# Patient Record
Sex: Male | Born: 1985 | Race: White | Hispanic: No | Marital: Married | Smoking: Never smoker
Health system: Southern US, Community
[De-identification: ages and names within clinical notes are randomized; demographics above are authoritative.]

## PROBLEM LIST (undated history)

## (undated) HISTORY — PX: BACK SURGERY: SHX140

## (undated) HISTORY — PX: KNEE SURGERY: SHX244

---

## 2018-12-04 ENCOUNTER — Ambulatory Visit
Admission: RE | Admit: 2018-12-04 | Discharge: 2018-12-04 | Disposition: A | Discharge status: Home / Self Care | Payer: BLUE CROSS/BLUE SHIELD | Attending: Family Medicine | Admitting: Family Medicine

## 2018-12-04 ENCOUNTER — Other Ambulatory Visit: Payer: Self-pay

## 2018-12-04 ENCOUNTER — Other Ambulatory Visit: Payer: Self-pay | Admitting: Family Medicine

## 2018-12-04 ENCOUNTER — Ambulatory Visit
Admission: RE | Admit: 2018-12-04 | Discharge: 2018-12-04 | Disposition: A | Discharge status: Home / Self Care | Payer: BLUE CROSS/BLUE SHIELD | Source: Ambulatory Visit | Attending: Family Medicine | Admitting: Family Medicine

## 2018-12-04 DIAGNOSIS — D334 Benign neoplasm of spinal cord: Secondary | ICD-10-CM | POA: Insufficient documentation

## 2019-03-08 ENCOUNTER — Ambulatory Visit (INDEPENDENT_AMBULATORY_CARE_PROVIDER_SITE_OTHER): Payer: BC Managed Care – PPO

## 2019-03-08 ENCOUNTER — Ambulatory Visit
Admission: EM | Admit: 2019-03-08 | Discharge: 2019-03-08 | Disposition: A | Discharge status: Home / Self Care | Payer: BC Managed Care – PPO | Attending: Family Medicine | Admitting: Family Medicine

## 2019-03-08 ENCOUNTER — Other Ambulatory Visit: Payer: Self-pay

## 2019-03-08 DIAGNOSIS — M79671 Pain in right foot: Secondary | ICD-10-CM | POA: Diagnosis not present

## 2019-03-08 DIAGNOSIS — S91331A Puncture wound without foreign body, right foot, initial encounter: Secondary | ICD-10-CM

## 2019-03-08 DIAGNOSIS — W450XXA Nail entering through skin, initial encounter: Secondary | ICD-10-CM | POA: Diagnosis not present

## 2019-03-08 DIAGNOSIS — Z23 Encounter for immunization: Secondary | ICD-10-CM

## 2019-03-08 MED ORDER — TETANUS-DIPHTH-ACELL PERTUSSIS 5-2.5-18.5 LF-MCG/0.5 IM SUSP
0.5000 mL | Freq: Once | INTRAMUSCULAR | Status: AC
  Administered 2019-03-08: 0.5 mL via INTRAMUSCULAR

## 2019-03-08 MED ORDER — CEPHALEXIN 500 MG PO CAPS: 500.0000 mg | ORAL_CAPSULE | Freq: Four times a day (QID) | ORAL | 0 refills | Status: DC

## 2019-03-08 MED ORDER — CIPROFLOXACIN HCL 500 MG PO TABS: 500.0000 mg | ORAL_TABLET | Freq: Two times a day (BID) | ORAL | 0 refills | Status: DC

## 2019-03-08 NOTE — ED Provider Notes (Signed)
[facility] ____________________________________________  Time seen: Approximately 12:19 PM  I have reviewed the triage vital signs and the nursing notes.   HISTORY  Chief Complaint Puncture Wound   HPI Terry Yang is a 33 y.o. male presenting for evaluation of right foot pain post injury that occurred just prior to arrival.  Patient reports this morning he was walking outside around his fire pit and stepped on a nail sticking out of a board.  States he was wearing thin rubber soled flip-flops.  States he immediately pulled his foot off of the nail removing the nail.  States the area did bleed immediately and then for a few more minutes.  Denies continued bleeding.  States currently the foot does not hurt.  Has remained ambulatory.  Denies aggravating or alleviating factors.  Unsure of last tetanus immunization.  Reports otherwise feeling well.  No recent cough, congestion, chest pain or shortness of breath or fevers.  No recent antibiotic use.   History reviewed. No pertinent past medical history.  There are no active problems to display for this patient.   Past Surgical History:  Procedure Laterality Date  . BACK SURGERY    . KNEE SURGERY Bilateral      No current facility-administered medications for this encounter.   Current Outpatient Medications:  .  cephALEXin (KEFLEX) 500 MG capsule, Take 1 capsule (500 mg total) by mouth 4 (four) times daily for 7 days., Disp: 28 capsule, Rfl: 0 .  ciprofloxacin (CIPRO) 500 MG tablet, Take 1 tablet (500 mg total) by mouth every 12 (twelve) hours for 3 days., Disp: 6 tablet, Rfl: 0  Allergies Patient has no known allergies.  History reviewed. No pertinent family history.  Social History Social History   Tobacco Use  . Smoking status: Never Smoker  . Smokeless tobacco: Never Used  Substance Use Topics  . Alcohol use: Yes    Comment: occasionally  . Drug use: Not Currently    Review of Systems  Constitutional: No fever ENT: No sore throat. Cardiovascular: Denies chest pain. Respiratory: Denies shortness of breath. Gastrointestinal: No abdominal pain.   Musculoskeletal: Positive for previous right foot pain. Skin: Positive wound.  ____________________________________________   PHYSICAL EXAM:  VITAL SIGNS: ED Triage Vitals  Enc Vitals Group     BP 03/08/19 1146 (!) 138/99     Pulse Rate 03/08/19 1146 84     Resp 03/08/19 1146 16     Temp 03/08/19 1146 98 F (36.7 C)     Temp Source 03/08/19 1146 Oral     SpO2 03/08/19 1146 100 %     Weight 03/08/19 1144 190 lb (86.2 kg)     Height 03/08/19 1144 5\' 8"  (1.727 m)     Head Circumference --      Peak Flow --      Pain Score 03/08/19 1144 2     Pain Loc --      Pain Edu? --      Excl. in [place]? --     Constitutional: Alert and oriented. Well appearing and in no acute distress. ENT      Head: Normocephalic and atraumatic. Cardiovascular: Normal rate, regular rhythm. Grossly normal heart sounds.  Good peripheral circulation. Respiratory: Normal respiratory effort without tachypnea nor retractions. Breath sounds are clear and equal bilaterally. No wheezes, rales, rhonchi. Musculoskeletal: Steady gait.  Right foot distal dorsalis pedis and posterior tibialis pulses equal and easily palpated. Neurologic:  Normal speech and language. No gross focal neurologic deficits are  appreciated. Speech is normal. No gait instability.  Skin:  Skin is warm, dry.  Except: Right plantar aspect distal midfoot less than 0.5 cm puncture wound visible, well approximated, no active bleeding, nontender, no erythema, no appearance of foreign body. Psychiatric: Mood and affect are normal. Speech and behavior are normal. Patient exhibits appropriate insight and judgment   ___________________________________________   LABS (all labs ordered are listed, but only abnormal results are displayed)  Labs Reviewed - No data to display  ____________________________________________  RADIOLOGY  Dg Foot Complete Right  Result Date: 03/08/2019 CLINICAL DATA:  33 year old male status post puncture wound to right foot. EXAM: RIGHT FOOT COMPLETE - 3+ VIEW COMPARISON:  None. FINDINGS: There is no evidence of fracture or dislocation. There is no evidence of arthropathy or other focal bone abnormality. Soft tissues are unremarkable. IMPRESSION: Negative. Electronically Signed   By: Jacqulynn Cadet M.D.   On: 03/08/2019 12:15   ____________________________________________   PROCEDURES Procedures   INITIAL IMPRESSION / ASSESSMENT AND PLAN / ED COURSE  Pertinent labs & imaging results that were available during my care of the patient were reviewed by me and considered in my medical decision making (see chart for details).  Well-appearing patient.  No acute distress.  Right foot plantar puncture wound from nail going through rubber flip-flop.  Right foot x-ray as above per radiologist, negative.  Wound cleaned in urgent care.  Nontender.  Will treat with prophylactic antibiotic use of 3-day course of Cipro and 7-day course of Keflex.  Topical antibiotic ointment.  Discussed keeping clean, warm soapy water soaks, Tylenol ibuprofen as needed.Discussed indication, risks and benefits of medications with patient.  Tetanus immunization updated.  Discussed follow up with Primary care physician this week. Discussed follow up and return parameters including no resolution or any worsening concerns. Patient verbalized understanding and agreed to plan.   ____________________________________________   FINAL CLINICAL IMPRESSION(S) / ED DIAGNOSES  Final diagnoses:  Puncture wound of plantar aspect of right foot, initial encounter     ED Discharge Orders         Ordered    cephALEXin (KEFLEX) 500 MG capsule  4 times daily     03/08/19 1239    ciprofloxacin (CIPRO) 500 MG tablet  Every 12 hours     03/08/19 1239           Note: This  dictation was prepared with Dragon dictation along with smaller phrase technology. Any transcriptional errors that result from this process are unintentional.         Marylene Land, NP 03/08/19 1352

## 2019-03-08 NOTE — ED Triage Notes (Signed)
Patient states that he stepped on a board with a rusty nail this morning and states that his right foot gushed blood for a little bit. Patient states that area is tender and his tetanus is out of date.

## 2019-03-08 NOTE — Discharge Instructions (Addendum)
Take medication as prescribed. Rest. Drink plenty of fluids. Warm soapy water soaks. Keep clean. Monitor.   Follow up with your primary care physician this week as needed. Return to Urgent care for new or worsening concerns.

## 2019-03-09 ENCOUNTER — Other Ambulatory Visit: Payer: Self-pay

## 2019-03-09 ENCOUNTER — Ambulatory Visit
Admission: EM | Admit: 2019-03-09 | Discharge: 2019-03-09 | Disposition: A | Discharge status: Home / Self Care | Payer: BC Managed Care – PPO | Attending: Family Medicine | Admitting: Family Medicine

## 2019-03-09 DIAGNOSIS — S91331D Puncture wound without foreign body, right foot, subsequent encounter: Secondary | ICD-10-CM

## 2019-03-09 DIAGNOSIS — R21 Rash and other nonspecific skin eruption: Secondary | ICD-10-CM | POA: Diagnosis not present

## 2019-03-09 DIAGNOSIS — T7840XA Allergy, unspecified, initial encounter: Secondary | ICD-10-CM

## 2019-03-09 MED ORDER — PREDNISONE 10 MG PO TABS: ORAL_TABLET | ORAL | 0 refills | Status: DC

## 2019-03-09 MED ORDER — SULFAMETHOXAZOLE-TRIMETHOPRIM 800-160 MG PO TABS: 1.0000 | ORAL_TABLET | Freq: Two times a day (BID) | ORAL | 0 refills | Status: AC

## 2019-03-09 MED ORDER — MUPIROCIN 2 % EX OINT: TOPICAL_OINTMENT | CUTANEOUS | 0 refills | Status: DC

## 2019-03-09 NOTE — ED Triage Notes (Signed)
Pt was given antibiotics yesterday and about 3 hours after taking the medication he began to develop what he thinks is hives on his torso, butt, groin and all over his body. Also complains of itching with it as well. Did take benadryl last night and this morning and not itching right now.

## 2019-03-09 NOTE — ED Provider Notes (Signed)
[facility] ____________________________________________  Time seen: Approximately 1:27 PM  I have reviewed the triage vital signs and the nursing notes.   HISTORY  Chief Complaint Rash (appt)   HPI Terry Yang is a 33 y.o. male presenting for evaluation of diffuse itchy rash that started within 3 hours after taking new antibiotics yesterday.  Patient was seen in urgent care yesterday after stepping on a nail to his right plantar aspect of foot through a rubber soled shoe.  Patient started on Keflex and Cipro.  States he took both these medications yesterday around 4 and around 7 he began having a very itchy raised rash.  Denies any other changes of contacts, medicines, detergents or other changes.  No marks prior to taking the antibiotics.  Denies any accompanying shortness of breath, chest pain, oral or throat swelling or difficulty swallowing.  Did take Benadryl last night and this morning which helps some but rash still persist.  Has not taken either of these antibiotics in the past.  Has previously taken Augmentin and tolerated well.  Tetanus immunization is up-to-date.  Reports otherwise doing well denies other complaints.  No fevers.  Patient reports right foot is somewhat tender but overall doing well, denies redness or drainage to right foot.  Zeb Comfort, MD: PCP   History reviewed. No pertinent past medical history. Denies   There are no active problems to display for this patient.   Past Surgical History:  Procedure Laterality Date  . BACK SURGERY    . KNEE SURGERY Bilateral      No current facility-administered medications for this encounter.   Current Outpatient Medications:  .  mupirocin ointment (BACTROBAN) 2 %, Apply two times a day for 7 days., Disp: 22 g, Rfl: 0 .  predniSONE (DELTASONE) 10 MG tablet, Start 60 mg po day one, then 50 mg po day two, taper by 10 mg daily until complete., Disp: 21 tablet, Rfl: 0 .   sulfamethoxazole-trimethoprim (BACTRIM DS) 800-160 MG tablet, Take 1 tablet by mouth 2 (two) times daily for 7 days., Disp: 14 tablet, Rfl: 0  Allergies Patient has no known allergies.  History reviewed. No pertinent family history.  Social History Social History   Tobacco Use  . Smoking status: Never Smoker  . Smokeless tobacco: Never Used  Substance Use Topics  . Alcohol use: Yes    Comment: occasionally  . Drug use: Not Currently    Review of Systems Constitutional: No fever ENT: No sore throat. Cardiovascular: Denies chest pain. Respiratory: Denies shortness of breath. Gastrointestinal: No abdominal pain.   Musculoskeletal: Negative for back pain. Skin: Positive for rash.  ____________________________________________   PHYSICAL EXAM:  VITAL SIGNS: ED Triage Vitals  Enc Vitals Group     BP 03/09/19 1252 (!) 129/102     Pulse Rate 03/09/19 1252 78     Resp 03/09/19 1252 18     Temp 03/09/19 1252 98 F (36.7 C)     Temp Source 03/09/19 1252 Oral     SpO2 03/09/19 1252 100 %     Weight 03/09/19 1254 190 lb 0.6 oz (86.2 kg)     Height --      Head Circumference --      Peak Flow --      Pain Score 03/09/19 1253 0     Pain Loc --      Pain Edu? --      Excl. in [place]? --     Constitutional: Alert and oriented. Well  appearing and in no acute distress. Eyes: Conjunctivae are normal.  ENT      Head: Normocephalic and atraumatic. Cardiovascular: Normal rate, regular rhythm. Grossly normal heart sounds.  Good peripheral circulation. Respiratory: Normal respiratory effort without tachypnea nor retractions. Breath sounds are clear and equal bilaterally. No wheezes, rales, rhonchi. Musculoskeletal:  Steady gait.   Neurologic:  Normal speech and language. No gross focal neurologic deficits are appreciated. Speech is normal. No gait instability.  Skin:  Skin is warm, dry. Except: Pruritic urticarial rash present to chest and abdomen, lower back, bilateral lower  extremities and some to bilateral upper extremities, no central punctum, nontender, no drainage. Except: Right mid distal plantar foot puncture wound present with minimal tenderness direct palpation, no erythema, no drainage. Psychiatric: Mood and affect are normal. Speech and behavior are normal. Patient exhibits appropriate insight and judgment   ___________________________________________   LABS (all labs ordered are listed, but only abnormal results are displayed)  Labs Reviewed - No data to display ____________________________________________   PROCEDURES Procedures     INITIAL IMPRESSION / ASSESSMENT AND PLAN / ED COURSE  Pertinent labs & imaging results that were available during my care of the patient were reviewed by me and considered in my medical decision making (see chart for details).  Well-appearing patient.  No acute distress.  Patient started on Cipro and Keflex yesterday for plantar puncture wound from a nail through flip-flop.  After taking antibiotic he began having diffuse itchy rash.  Suspect allergic reaction, unclear if to Cipro or Keflex.  Will stop both antibiotics.  Will start prednisone taper.  Continue Benadryl at night and Claritin during the day.  We will start Bactroban ointment and Bactrim.  Discussed very clearly with the patient due to risk of potential Pseudomonas through rubber sole flip-flopped to monitor wound closely and for any increasing redness or drainage proceed directly to the emergency room.  Supportive care. Discussed indication, risks and benefits of medications with patient.   Discussed follow up with Primary care physician this week. Discussed follow up and return parameters including no resolution or any worsening concerns. Patient verbalized understanding and agreed to plan.   ____________________________________________   FINAL CLINICAL IMPRESSION(S) / ED DIAGNOSES  Final diagnoses:  Allergic reaction, initial encounter  Puncture  wound of plantar aspect of right foot, subsequent encounter     ED Discharge Orders         Ordered    sulfamethoxazole-trimethoprim (BACTRIM DS) 800-160 MG tablet  2 times daily     03/09/19 1328    predniSONE (DELTASONE) 10 MG tablet     03/09/19 1328    mupirocin ointment (BACTROBAN) 2 %     03/09/19 1329           Note: This dictation was prepared with Dragon dictation along with smaller phrase technology. Any transcriptional errors that result from this process are unintentional.         Marylene Land, NP 03/09/19 1349

## 2019-03-09 NOTE — Discharge Instructions (Addendum)
Stop taking cephalexin and ciprofloxacin.  START taking new medication as prescribed. Rest. Drink plenty of fluids. Keep clean.  Continue Benadryl at night and Claritin in the morning.Monitor closely.   Follow up with your primary care physician this week as needed.  Return to urgent care as needed.    Proceed directly to emergency room for any foot swelling, drainage, fevers or worsening concerns.

## 2019-11-12 IMAGING — CR RIGHT FOOT COMPLETE - 3+ VIEW
3 series · 3 of 3 positions shown · non-contrast
Comparison: None.

CLINICAL DATA: 32-year-old male status post puncture wound to right
foot.

EXAM:
RIGHT FOOT COMPLETE - 3+ VIEW

[foot ap]
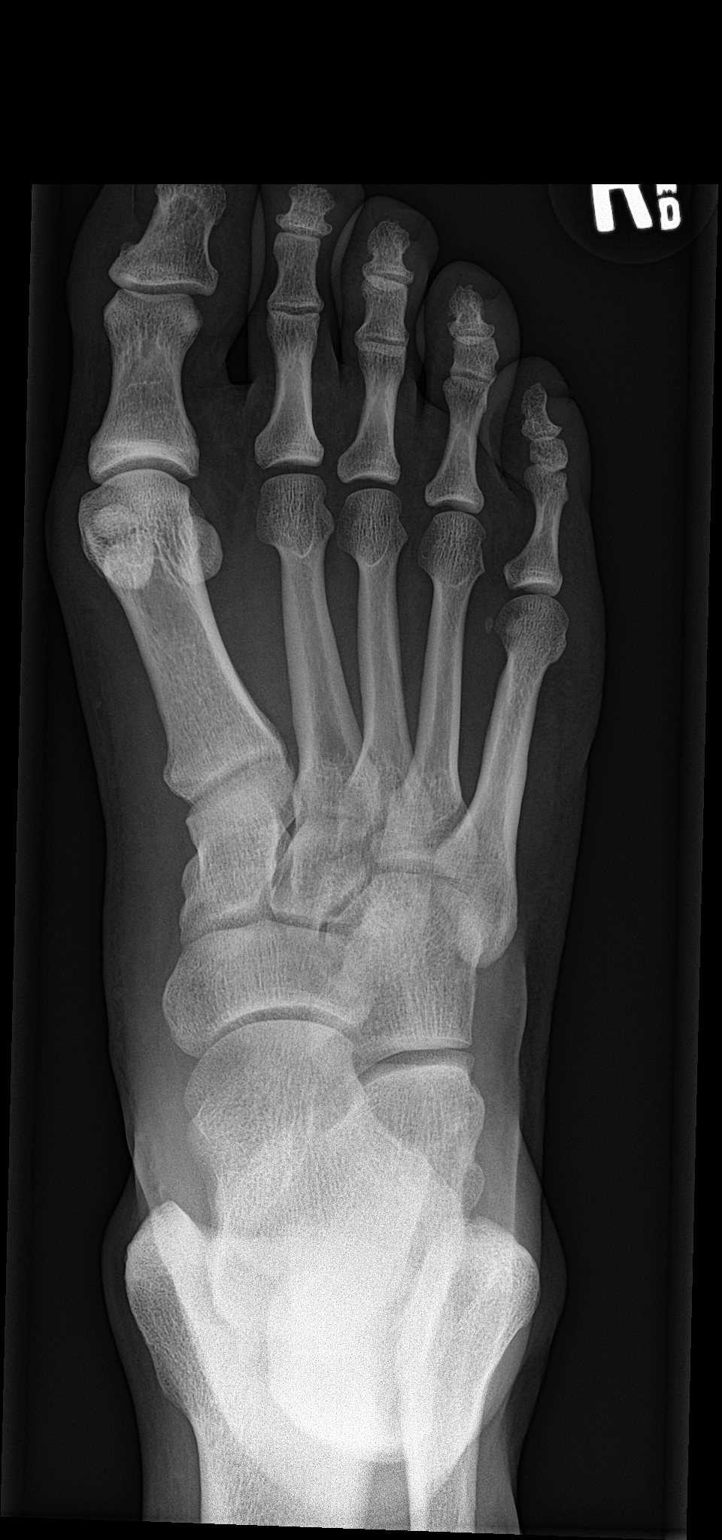

[foot obl]
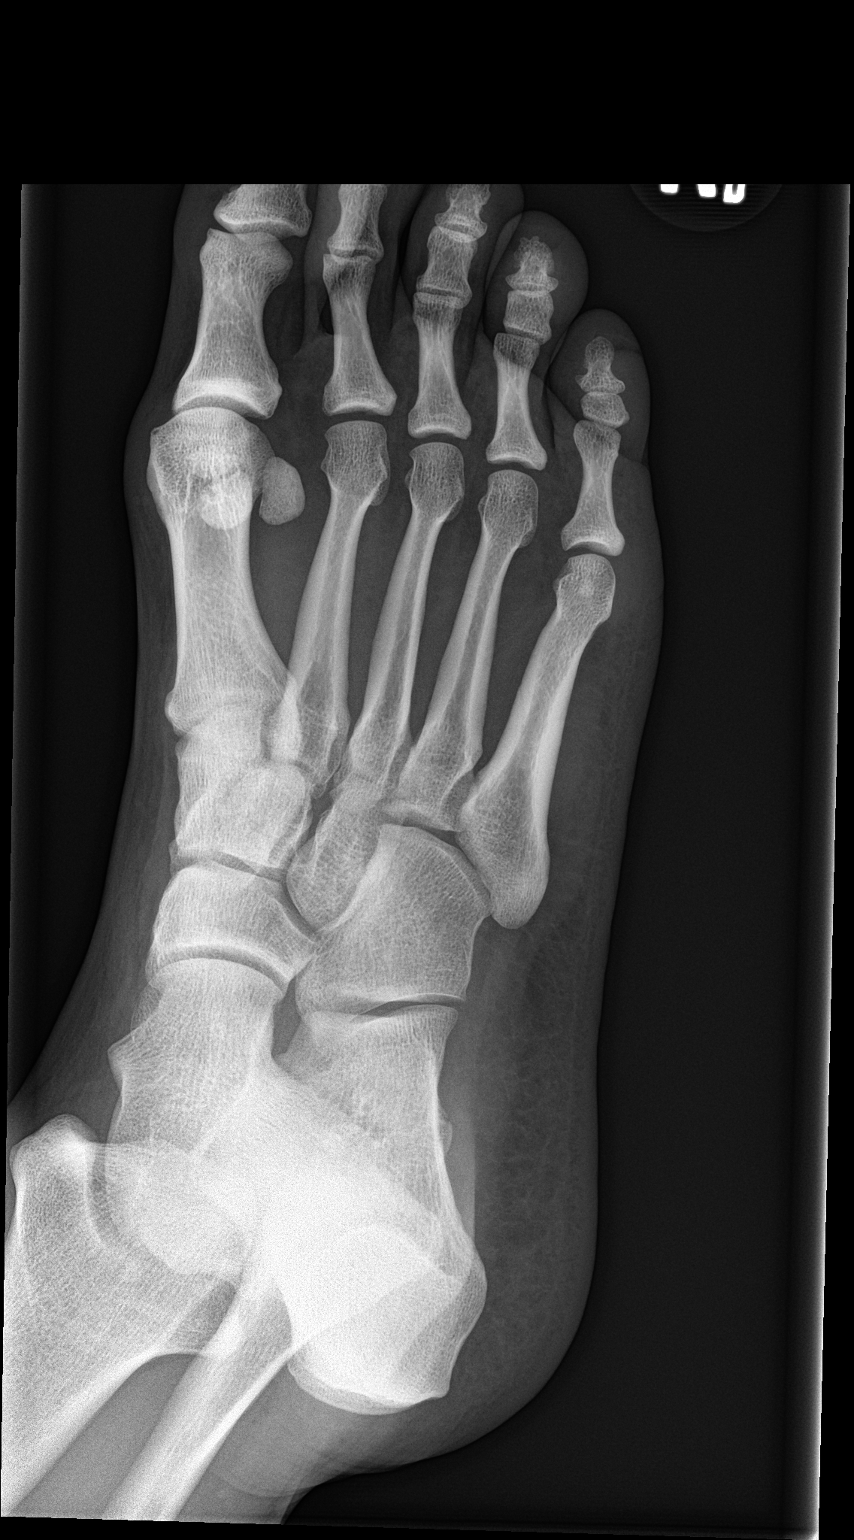

[foot lat]
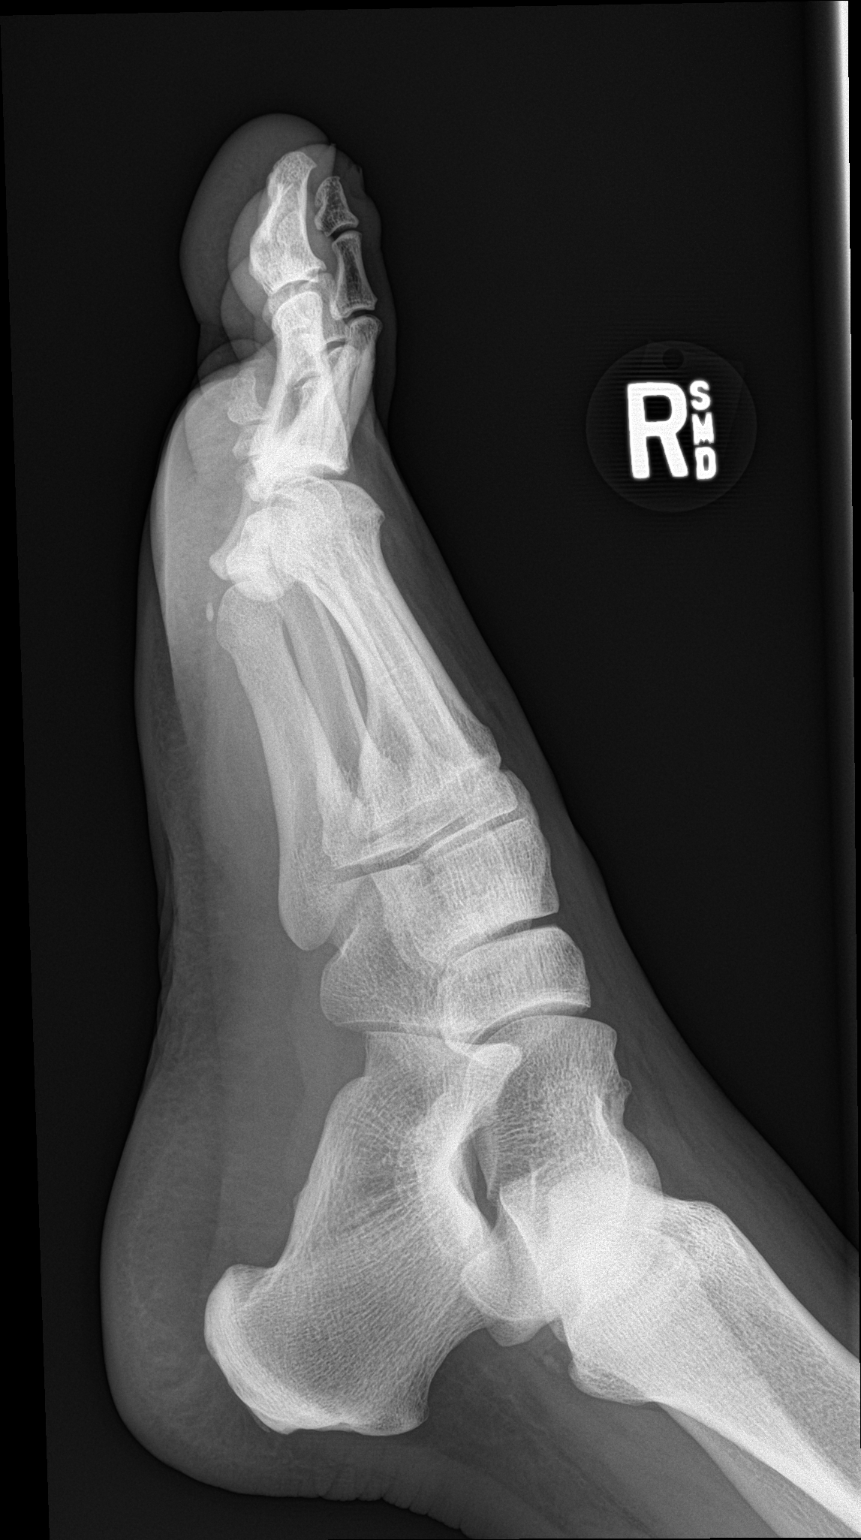

[3 of 3 positions shown; findings below may reference images not displayed]

FINDINGS: There is no evidence of fracture or dislocation. There is no
evidence of arthropathy or other focal bone abnormality. Soft
tissues are unremarkable.
IMPRESSION: Negative.

## 2022-02-05 ENCOUNTER — Encounter: Payer: Self-pay | Admitting: Emergency Medicine

## 2022-02-05 ENCOUNTER — Ambulatory Visit
Admission: EM | Admit: 2022-02-05 | Discharge: 2022-02-05 | Disposition: A | Discharge status: Home / Self Care | Payer: BC Managed Care – PPO | Attending: Emergency Medicine | Admitting: Emergency Medicine

## 2022-02-05 DIAGNOSIS — S39011A Strain of muscle, fascia and tendon of abdomen, initial encounter: Secondary | ICD-10-CM

## 2022-02-05 MED ORDER — KETOROLAC TROMETHAMINE 60 MG/2ML IM SOLN
30.0000 mg | Freq: Once | INTRAMUSCULAR | Status: AC
  Administered 2022-02-05: 30 mg via INTRAMUSCULAR

## 2022-02-05 MED ORDER — KETOROLAC TROMETHAMINE 10 MG PO TABS: 10.0000 mg | ORAL_TABLET | Freq: Four times a day (QID) | ORAL | 0 refills | Status: AC | PRN

## 2022-02-05 NOTE — Discharge Instructions (Addendum)
Avoid any straining, twisting, or lifting over 15 pounds for the next 2 weeks.  Use the Toradol every 6 hours as needed for pain and inflammation.  Take this with food.  You may also use over-the-counter Tylenol to help with pain relief.  Apply moist heat to your abdomen for 20 minutes at a time 2-3 times a day to help improve blood flow and aid in healing.  If you have a resurgence of significant abdominal pain, develop nausea or vomiting, or have any other worsening symptoms please go to the ER for evaluation.

## 2022-02-05 NOTE — ED Provider Notes (Signed)
[facility]    CSN: [id number] Arrival date & time: 02/05/22  1250      History   Chief Complaint Chief Complaint  Patient presents with   Abdominal Pain    HPI Guillaume Weninger is a 36 y.o. male.   HPI  36 year old male here for evaluation of abdominal pain.  Patient is here with his significant other for evaluation of acute onset of abdominal pain that occurred while playing golf.  He states that he swung his golf club and then felt a sharp intense pain in the middle of his abdomen that goes through to his back.  He states that it is constant and has been present for the last hour.  He is rating his pain as a 10/10.  He has had some associated nausea but no vomiting.  No prior history of abdominal surgeries or hernias.  Patient is pale in the exam room.  History reviewed. No pertinent past medical history.  There are no problems to display for this patient.   Past Surgical History:  Procedure Laterality Date   BACK SURGERY     KNEE SURGERY Bilateral        Home Medications    Prior to Admission medications   Medication Sig Start Date End Date Taking? Authorizing Provider  ketorolac (TORADOL) 10 MG tablet Take 1 tablet (10 mg total) by mouth every 6 (six) hours as needed. 02/05/22  Yes Margarette Canada, NP    Family History History reviewed. No pertinent family history.  Social History Social History   Tobacco Use   Smoking status: Never   Smokeless tobacco: Never  Vaping Use   Vaping Use: Never used  Substance Use Topics   Alcohol use: Yes    Comment: occasionally   Drug use: Not Currently     Allergies   Patient has no known allergies.   Review of Systems Review of Systems  Gastrointestinal:  Positive for abdominal pain and nausea. Negative for vomiting.  Skin:  Positive for color change.  Hematological: Negative.   Psychiatric/Behavioral: Negative.      Physical Exam Triage Vital Signs ED Triage Vitals  Enc Vitals Group     BP  02/05/22 1257 (!) 155/106     Pulse Rate 02/05/22 1257 68     Resp 02/05/22 1257 15     Temp 02/05/22 1257 97.7 F (36.5 C)     Temp Source 02/05/22 1257 Oral     SpO2 02/05/22 1257 100 %     Weight 02/05/22 1256 190 lb (86.2 kg)     Height 02/05/22 1256 '5\' 8"'$  (1.727 m)     Head Circumference --      Peak Flow --      Pain Score 02/05/22 1255 7     Pain Loc --      Pain Edu? --      Excl. in [place]? --    No data found.  Updated Vital Signs BP (!) 155/106 (BP Location: Left Arm)   Pulse 68   Temp 97.7 F (36.5 C) (Oral)   Resp 15   Ht '5\' 8"'$  (1.727 m)   Wt 190 lb (86.2 kg)   SpO2 100%   BMI 28.89 kg/m   Visual Acuity Right Eye Distance:   Left Eye Distance:   Bilateral Distance:    Right Eye Near:   Left Eye Near:    Bilateral Near:     Physical Exam Vitals and nursing note reviewed.  Constitutional:  General: He is in acute distress.     Appearance: Normal appearance. He is not ill-appearing.  HENT:     Head: Normocephalic and atraumatic.  Cardiovascular:     Rate and Rhythm: Normal rate and regular rhythm.     Pulses: Normal pulses.     Heart sounds: Normal heart sounds. No murmur heard.   No friction rub. No gallop.  Pulmonary:     Effort: Pulmonary effort is normal.     Breath sounds: Normal breath sounds. No wheezing, rhonchi or rales.  Abdominal:     General: Abdomen is flat.     Palpations: Abdomen is soft.     Tenderness: There is abdominal tenderness. There is no guarding or rebound.  Skin:    General: Skin is warm and dry.     Capillary Refill: Capillary refill takes less than 2 seconds.     Coloration: Skin is pale.  Neurological:     General: No focal deficit present.     Mental Status: He is alert and oriented to person, place, and time.  Psychiatric:        Mood and Affect: Mood normal.        Behavior: Behavior normal.        Thought Content: Thought content normal.        Judgment: Judgment normal.     UC Treatments / Results   Labs (all labs ordered are listed, but only abnormal results are displayed) Labs Reviewed - No data to display  EKG   Radiology No results found.  Procedures Procedures (including critical care time)  Medications Ordered in UC Medications  ketorolac (TORADOL) injection 30 mg (30 mg Intramuscular Given 02/05/22 1311)    Initial Impression / Assessment and Plan / UC Course  I have reviewed the triage vital signs and the nursing notes.  Pertinent labs & imaging results that were available during my care of the patient were reviewed by me and considered in my medical decision making (see chart for details).  Patient is a 36 year old male who does appear to be in a significant degree distress presenting for evaluation of acute onset upper abdominal pain that occurred after swinging a golf club approximately an hour ago.  He states that the pain is sharp and constant and rates it a 10/10.  This is inducing some nausea but he has not had any vomiting.  No prior history of abdominal surgeries or hernias.  On exam patient is mildly pale but he is alert and conversant.  His physical exam reveals a benign cardiopulmonary exam with S1-S2 heart sounds with regular rate and rhythm and lung sounds are clear to auscultation in all fields.  His abdomen is soft with positive bowel sounds in all 4 quadrants.  No pulsatile masses appreciated on exam or bruits auscultated over the epigastrium or midline abdomen.  He does have tenderness in his upper central abdomen that tends to fade when palpating laterally.  There is no lower abdominal tenderness.  There is no appreciable defect in the abdominal wall.  I did have the patient tighten his abdominal muscles and he states that does not increase his pain there are no palpable masses or hernias present.  I suspect that patient may have strained his abdominal wall playing golf.  I will administer a Toradol injection in clinic to see if his pain improves.  If there is  no improvement of his pain I will send the patient to the ER for evaluation.  Patient  and his significant other are in agreement.  Following the Toradol administration patient rates his pain as a 3/10 and describes it more as just a constant cramp in his abdomen.  His color has returned as well.  I will discharge the patient home with a diagnosis of abdominal wall strain with a continued prescription for Toradol tablets.  I have encouraged him to rest and to not pick up anything over 15 pounds.  He is also to avoid any intense straining.  If he has a resurgence of pain, or worsening of symptoms, he is to go to the ER for evaluation.   Final Clinical Impressions(s) / UC Diagnoses   Final diagnoses:  Abdominal wall strain, initial encounter     Discharge Instructions      Avoid any straining, twisting, or lifting over 15 pounds for the next 2 weeks.  Use the Toradol every 6 hours as needed for pain and inflammation.  Take this with food.  You may also use over-the-counter Tylenol to help with pain relief.  Apply moist heat to your abdomen for 20 minutes at a time 2-3 times a day to help improve blood flow and aid in healing.  If you have a resurgence of significant abdominal pain, develop nausea or vomiting, or have any other worsening symptoms please go to the ER for evaluation.     ED Prescriptions     Medication Sig Dispense Auth. Provider   ketorolac (TORADOL) 10 MG tablet Take 1 tablet (10 mg total) by mouth every 6 (six) hours as needed. 20 tablet Margarette Canada, NP      PDMP not reviewed this encounter.   Margarette Canada, NP 02/05/22 1339

## 2022-02-05 NOTE — ED Triage Notes (Signed)
Patient states that he was playing golf and states that when he swung felt a sharp pain across his mid abdomen.  Patient states that the pain has been constant for a hour.  Patient reports some nausea.  Patient denies V/D.  Patient denies chest pain or SOB.
